# Patient Record
Sex: Male | Born: 1980 | Race: White | Hispanic: No | Marital: Single | Smoking: Current every day smoker
Health system: Southern US, Community
[De-identification: ages and names within clinical notes are randomized; demographics above are authoritative.]

---

## 2011-01-27 ENCOUNTER — Emergency Department: Payer: Self-pay | Admitting: Emergency Medicine

## 2011-01-29 ENCOUNTER — Ambulatory Visit: Payer: Self-pay | Admitting: Family Medicine

## 2012-06-23 ENCOUNTER — Emergency Department: Payer: Self-pay

## 2012-06-23 LAB — HEPATIC FUNCTION PANEL A (ARMC)
Alkaline Phosphatase: 79 U/L (ref 50–136)
Bilirubin, Direct: 0.2 mg/dL (ref 0.00–0.20)
Bilirubin,Total: 1.1 mg/dL — ABNORMAL HIGH (ref 0.2–1.0)
SGOT(AST): 65 U/L — ABNORMAL HIGH (ref 15–37)
SGPT (ALT): 48 U/L (ref 12–78)

## 2012-06-23 LAB — DRUG SCREEN, URINE
Amphetamines, Ur Screen: NEGATIVE (ref ?–1000)
Barbiturates, Ur Screen: NEGATIVE (ref ?–200)
Cannabinoid 50 Ng, Ur ~~LOC~~: NEGATIVE (ref ?–50)
Methadone, Ur Screen: NEGATIVE (ref ?–300)
Opiate, Ur Screen: POSITIVE (ref ?–300)
Tricyclic, Ur Screen: NEGATIVE (ref ?–1000)

## 2012-06-23 LAB — BASIC METABOLIC PANEL
Anion Gap: 7 (ref 7–16)
BUN: 12 mg/dL (ref 7–18)
Calcium, Total: 7.5 mg/dL — ABNORMAL LOW (ref 8.5–10.1)
Chloride: 84 mmol/L — ABNORMAL LOW (ref 98–107)
EGFR (African American): >60
EGFR (Non-African Amer.): >60
Glucose: 102 mg/dL — ABNORMAL HIGH (ref 65–99)
Osmolality: 231 (ref 275–301)

## 2012-06-23 LAB — CBC
HCT: 41.3 % (ref 40.0–52.0)
MCV: 96 fL (ref 80–100)
RBC: 4.32 10*6/uL — ABNORMAL LOW (ref 4.40–5.90)
WBC: 9.7 10*3/uL (ref 3.8–10.6)

## 2013-04-14 ENCOUNTER — Inpatient Hospital Stay: Payer: Self-pay | Admitting: Psychiatry

## 2013-04-14 LAB — DRUG SCREEN, URINE
Amphetamines, Ur Screen: NEGATIVE (ref ?–1000)
Barbiturates, Ur Screen: NEGATIVE (ref ?–200)
Cannabinoid 50 Ng, Ur ~~LOC~~: NEGATIVE (ref ?–50)
Methadone, Ur Screen: NEGATIVE (ref ?–300)
Opiate, Ur Screen: NEGATIVE (ref ?–300)
Phencyclidine (PCP) Ur S: NEGATIVE (ref ?–25)
Tricyclic, Ur Screen: NEGATIVE (ref ?–1000)

## 2013-04-14 LAB — URINALYSIS, COMPLETE
Bacteria: NONE SEEN
Blood: NEGATIVE
Glucose,UR: NEGATIVE mg/dL (ref 0–75)
Leukocyte Esterase: NEGATIVE
Nitrite: NEGATIVE
Protein: NEGATIVE
RBC,UR: <1 /HPF (ref 0–5)
Specific Gravity: 1.024 (ref 1.003–1.030)
Squamous Epithelial: <1

## 2013-04-14 LAB — COMPREHENSIVE METABOLIC PANEL
Alkaline Phosphatase: 84 U/L (ref 50–136)
BUN: 8 mg/dL (ref 7–18)
Chloride: 107 mmol/L (ref 98–107)
EGFR (African American): >60
SGOT(AST): 135 U/L — ABNORMAL HIGH (ref 15–37)
SGPT (ALT): 91 U/L — ABNORMAL HIGH (ref 12–78)
Sodium: 142 mmol/L (ref 136–145)
Total Protein: 7.8 g/dL (ref 6.4–8.2)

## 2013-04-14 LAB — ETHANOL: Ethanol: 365 mg/dL

## 2013-04-14 LAB — CBC
HCT: 46.9 % (ref 40.0–52.0)
HGB: 16.1 g/dL (ref 13.0–18.0)
Platelet: 167 10*3/uL (ref 150–440)
RDW: 15.1 % — ABNORMAL HIGH (ref 11.5–14.5)
WBC: 8.1 10*3/uL (ref 3.8–10.6)

## 2013-04-14 LAB — TSH: Thyroid Stimulating Horm: 1.07 u[IU]/mL

## 2013-04-17 LAB — HEPATIC FUNCTION PANEL A (ARMC)
Albumin: 3.8 g/dL (ref 3.4–5.0)
Alkaline Phosphatase: 72 U/L (ref 50–136)
Bilirubin, Direct: 0.2 mg/dL (ref 0.00–0.20)
Bilirubin,Total: 0.9 mg/dL (ref 0.2–1.0)
SGOT(AST): 75 U/L — ABNORMAL HIGH (ref 15–37)
SGPT (ALT): 79 U/L — ABNORMAL HIGH (ref 12–78)
Total Protein: 6.5 g/dL (ref 6.4–8.2)

## 2013-09-12 LAB — COMPREHENSIVE METABOLIC PANEL
ALBUMIN: 4.5 g/dL (ref 3.4–5.0)
AST: 70 U/L — AB (ref 15–37)
Alkaline Phosphatase: 85 U/L
Anion Gap: 7 (ref 7–16)
BUN: 7 mg/dL (ref 7–18)
Bilirubin,Total: 0.5 mg/dL (ref 0.2–1.0)
CALCIUM: 8.9 mg/dL (ref 8.5–10.1)
CHLORIDE: 108 mmol/L — AB (ref 98–107)
CO2: 29 mmol/L (ref 21–32)
Creatinine: 0.71 mg/dL (ref 0.60–1.30)
EGFR (Non-African Amer.): >60
Glucose: 98 mg/dL (ref 65–99)
OSMOLALITY: 285 (ref 275–301)
Potassium: 3.2 mmol/L — ABNORMAL LOW (ref 3.5–5.1)
SGPT (ALT): 62 U/L (ref 12–78)
Sodium: 144 mmol/L (ref 136–145)
Total Protein: 7.9 g/dL (ref 6.4–8.2)

## 2013-09-12 LAB — CBC
HCT: 51.4 % (ref 40.0–52.0)
HGB: 17 g/dL (ref 13.0–18.0)
MCH: 32.5 pg (ref 26.0–34.0)
MCHC: 33.1 g/dL (ref 32.0–36.0)
MCV: 98 fL (ref 80–100)
PLATELETS: 177 10*3/uL (ref 150–440)
RBC: 5.24 10*6/uL (ref 4.40–5.90)
RDW: 14.5 % (ref 11.5–14.5)
WBC: 8.2 10*3/uL (ref 3.8–10.6)

## 2013-09-12 LAB — URINALYSIS, COMPLETE
BILIRUBIN, UR: NEGATIVE
Bacteria: NONE SEEN
Blood: NEGATIVE
Glucose,UR: NEGATIVE mg/dL (ref 0–75)
Ketone: NEGATIVE
LEUKOCYTE ESTERASE: NEGATIVE
Nitrite: NEGATIVE
Ph: 5 (ref 4.5–8.0)
Protein: NEGATIVE
RBC,UR: NONE SEEN /HPF (ref 0–5)
Specific Gravity: 1.008 (ref 1.003–1.030)
Squamous Epithelial: NONE SEEN
WBC UR: 2 /HPF (ref 0–5)

## 2013-09-12 LAB — DRUG SCREEN, URINE
Amphetamines, Ur Screen: NEGATIVE (ref ?–1000)
BENZODIAZEPINE, UR SCRN: NEGATIVE (ref ?–200)
Barbiturates, Ur Screen: NEGATIVE (ref ?–200)
CANNABINOID 50 NG, UR ~~LOC~~: NEGATIVE (ref ?–50)
Cocaine Metabolite,Ur ~~LOC~~: NEGATIVE (ref ?–300)
MDMA (ECSTASY) UR SCREEN: NEGATIVE (ref ?–500)
Methadone, Ur Screen: NEGATIVE (ref ?–300)
Opiate, Ur Screen: NEGATIVE (ref ?–300)
PHENCYCLIDINE (PCP) UR S: NEGATIVE (ref ?–25)
Tricyclic, Ur Screen: NEGATIVE (ref ?–1000)

## 2013-09-12 LAB — SALICYLATE LEVEL: SALICYLATES, SERUM: 2.9 mg/dL — AB

## 2013-09-12 LAB — ETHANOL
Ethanol %: 0.543 % (ref 0.000–0.080)
Ethanol: 543 mg/dL

## 2013-09-12 LAB — ACETAMINOPHEN LEVEL: Acetaminophen: <2 ug/mL

## 2013-09-13 ENCOUNTER — Inpatient Hospital Stay: Payer: Self-pay | Admitting: Psychiatry

## 2013-09-13 LAB — ETHANOL
Ethanol %: 0.137 % — ABNORMAL HIGH (ref 0.000–0.080)
Ethanol: 137 mg/dL

## 2013-10-05 ENCOUNTER — Emergency Department: Payer: Self-pay | Admitting: Emergency Medicine

## 2013-10-05 LAB — COMPREHENSIVE METABOLIC PANEL
ALBUMIN: 4.4 g/dL (ref 3.4–5.0)
ALT: 55 U/L (ref 12–78)
AST: 39 U/L — AB (ref 15–37)
Alkaline Phosphatase: 103 U/L
Anion Gap: 6 — ABNORMAL LOW (ref 7–16)
BUN: 10 mg/dL (ref 7–18)
Bilirubin,Total: 0.4 mg/dL (ref 0.2–1.0)
CHLORIDE: 109 mmol/L — AB (ref 98–107)
CO2: 28 mmol/L (ref 21–32)
CREATININE: 0.9 mg/dL (ref 0.60–1.30)
Calcium, Total: 9 mg/dL (ref 8.5–10.1)
EGFR (Non-African Amer.): >60
GLUCOSE: 95 mg/dL (ref 65–99)
OSMOLALITY: 284 (ref 275–301)
POTASSIUM: 3.8 mmol/L (ref 3.5–5.1)
SODIUM: 143 mmol/L (ref 136–145)
Total Protein: 7.9 g/dL (ref 6.4–8.2)

## 2013-10-05 LAB — URINALYSIS, COMPLETE
Bilirubin,UR: NEGATIVE
Blood: NEGATIVE
GLUCOSE, UR: NEGATIVE mg/dL (ref 0–75)
Ketone: NEGATIVE
NITRITE: NEGATIVE
Ph: 5 (ref 4.5–8.0)
Protein: NEGATIVE
RBC,UR: 1 /HPF (ref 0–5)
Specific Gravity: 1.013 (ref 1.003–1.030)
Squamous Epithelial: NONE SEEN
WBC UR: 29 /HPF (ref 0–5)

## 2013-10-05 LAB — CBC
HCT: 45.1 % (ref 40.0–52.0)
HGB: 15.6 g/dL (ref 13.0–18.0)
MCH: 32.5 pg (ref 26.0–34.0)
MCHC: 34.5 g/dL (ref 32.0–36.0)
MCV: 94 fL (ref 80–100)
PLATELETS: 262 10*3/uL (ref 150–440)
RBC: 4.8 10*6/uL (ref 4.40–5.90)
RDW: 13 % (ref 11.5–14.5)
WBC: 7.4 10*3/uL (ref 3.8–10.6)

## 2013-10-05 LAB — DRUG SCREEN, URINE
AMPHETAMINES, UR SCREEN: NEGATIVE (ref ?–1000)
Barbiturates, Ur Screen: NEGATIVE (ref ?–200)
Benzodiazepine, Ur Scrn: POSITIVE (ref ?–200)
Cannabinoid 50 Ng, Ur ~~LOC~~: NEGATIVE (ref ?–50)
Cocaine Metabolite,Ur ~~LOC~~: NEGATIVE (ref ?–300)
MDMA (ECSTASY) UR SCREEN: NEGATIVE (ref ?–500)
Methadone, Ur Screen: NEGATIVE (ref ?–300)
OPIATE, UR SCREEN: NEGATIVE (ref ?–300)
PHENCYCLIDINE (PCP) UR S: NEGATIVE (ref ?–25)
Tricyclic, Ur Screen: NEGATIVE (ref ?–1000)

## 2013-10-05 LAB — SALICYLATE LEVEL: Salicylates, Serum: 2.3 mg/dL

## 2013-10-05 LAB — ETHANOL
ETHANOL LVL: 292 mg/dL
Ethanol %: 0.292 % — ABNORMAL HIGH (ref 0.000–0.080)

## 2013-10-05 LAB — ACETAMINOPHEN LEVEL: Acetaminophen: <2 ug/mL

## 2013-10-06 LAB — URINE CULTURE

## 2013-10-10 ENCOUNTER — Emergency Department: Payer: Self-pay | Admitting: Emergency Medicine

## 2013-10-10 LAB — URINALYSIS, COMPLETE
BACTERIA: NONE SEEN
BILIRUBIN, UR: NEGATIVE
Blood: NEGATIVE
GLUCOSE, UR: NEGATIVE mg/dL (ref 0–75)
Hyaline Cast: 6
Ketone: NEGATIVE
Leukocyte Esterase: NEGATIVE
Nitrite: NEGATIVE
Ph: 5 (ref 4.5–8.0)
Protein: 30
RBC,UR: 1 /HPF (ref 0–5)
Specific Gravity: 1.017 (ref 1.003–1.030)
Squamous Epithelial: NONE SEEN
WBC UR: 4 /HPF (ref 0–5)

## 2013-10-10 LAB — CBC
HCT: 50.5 % (ref 40.0–52.0)
HGB: 16.5 g/dL (ref 13.0–18.0)
MCH: 30.8 pg (ref 26.0–34.0)
MCHC: 32.7 g/dL (ref 32.0–36.0)
MCV: 94 fL (ref 80–100)
PLATELETS: 256 10*3/uL (ref 150–440)
RBC: 5.35 10*6/uL (ref 4.40–5.90)
RDW: 13.5 % (ref 11.5–14.5)
WBC: 9 10*3/uL (ref 3.8–10.6)

## 2013-10-10 LAB — COMPREHENSIVE METABOLIC PANEL
ALK PHOS: 116 U/L
Albumin: 4.7 g/dL (ref 3.4–5.0)
Anion Gap: 8 (ref 7–16)
BUN: 9 mg/dL (ref 7–18)
Bilirubin,Total: 0.5 mg/dL (ref 0.2–1.0)
CHLORIDE: 105 mmol/L (ref 98–107)
CO2: 27 mmol/L (ref 21–32)
Calcium, Total: 9.3 mg/dL (ref 8.5–10.1)
Creatinine: 0.8 mg/dL (ref 0.60–1.30)
EGFR (African American): >60
GLUCOSE: 90 mg/dL (ref 65–99)
Osmolality: 278 (ref 275–301)
POTASSIUM: 4.1 mmol/L (ref 3.5–5.1)
SGOT(AST): 42 U/L — ABNORMAL HIGH (ref 15–37)
SGPT (ALT): 53 U/L (ref 12–78)
SODIUM: 140 mmol/L (ref 136–145)
Total Protein: 8.6 g/dL — ABNORMAL HIGH (ref 6.4–8.2)

## 2013-10-10 LAB — DRUG SCREEN, URINE

## 2013-10-10 LAB — ETHANOL
Ethanol %: 0.392 % (ref 0.000–0.080)
Ethanol: 392 mg/dL

## 2013-10-10 LAB — SALICYLATE LEVEL: Salicylates, Serum: 3.3 mg/dL — ABNORMAL HIGH

## 2013-10-10 LAB — ACETAMINOPHEN LEVEL: Acetaminophen: <2 ug/mL

## 2013-12-07 LAB — ETHANOL
Ethanol %: 0.437 % (ref 0.000–0.080)
Ethanol: 437 mg/dL

## 2013-12-07 LAB — CBC
HCT: 47.2 % (ref 40.0–52.0)
HGB: 15.3 g/dL (ref 13.0–18.0)
MCH: 31.5 pg (ref 26.0–34.0)
MCHC: 32.4 g/dL (ref 32.0–36.0)
MCV: 97 fL (ref 80–100)
PLATELETS: 268 10*3/uL (ref 150–440)
RBC: 4.85 10*6/uL (ref 4.40–5.90)
RDW: 15.2 % — ABNORMAL HIGH (ref 11.5–14.5)
WBC: 6.9 10*3/uL (ref 3.8–10.6)

## 2013-12-07 LAB — COMPREHENSIVE METABOLIC PANEL
ALBUMIN: 4.7 g/dL (ref 3.4–5.0)
AST: 56 U/L — AB (ref 15–37)
Alkaline Phosphatase: 92 U/L
Anion Gap: 8 (ref 7–16)
BUN: 10 mg/dL (ref 7–18)
Bilirubin,Total: 0.7 mg/dL (ref 0.2–1.0)
CHLORIDE: 109 mmol/L — AB (ref 98–107)
CO2: 25 mmol/L (ref 21–32)
Calcium, Total: 9.2 mg/dL (ref 8.5–10.1)
Creatinine: 0.78 mg/dL (ref 0.60–1.30)
EGFR (African American): >60
EGFR (Non-African Amer.): >60
Glucose: 87 mg/dL (ref 65–99)
OSMOLALITY: 282 (ref 275–301)
Potassium: 3.9 mmol/L (ref 3.5–5.1)
SGPT (ALT): 46 U/L (ref 12–78)
Sodium: 142 mmol/L (ref 136–145)
Total Protein: 8.1 g/dL (ref 6.4–8.2)

## 2013-12-07 LAB — SALICYLATE LEVEL: SALICYLATES, SERUM: 2.3 mg/dL

## 2013-12-07 LAB — ACETAMINOPHEN LEVEL: Acetaminophen: <2 ug/mL

## 2013-12-08 ENCOUNTER — Inpatient Hospital Stay: Payer: Self-pay | Admitting: Psychiatry

## 2013-12-08 LAB — URINALYSIS, COMPLETE
BILIRUBIN, UR: NEGATIVE
Bacteria: NONE SEEN
Blood: NEGATIVE
Glucose,UR: NEGATIVE mg/dL (ref 0–75)
Nitrite: NEGATIVE
Ph: 5 (ref 4.5–8.0)
Protein: NEGATIVE
RBC,UR: 3 /HPF (ref 0–5)
Specific Gravity: 1.026 (ref 1.003–1.030)
Squamous Epithelial: NONE SEEN

## 2013-12-08 LAB — DRUG SCREEN, URINE

## 2013-12-08 LAB — ETHANOL
Ethanol %: 0.059 % (ref 0.000–0.080)
Ethanol: 59 mg/dL

## 2014-04-20 ENCOUNTER — Ambulatory Visit: Payer: Self-pay

## 2014-04-26 ENCOUNTER — Emergency Department: Payer: Self-pay | Admitting: Student

## 2014-04-26 LAB — CBC
HCT: 46.1 % (ref 40.0–52.0)
HGB: 14.9 g/dL (ref 13.0–18.0)
MCH: 32.1 pg (ref 26.0–34.0)
MCHC: 32.3 g/dL (ref 32.0–36.0)
MCV: 99 fL (ref 80–100)
Platelet: 218 10*3/uL (ref 150–440)
RBC: 4.64 10*6/uL (ref 4.40–5.90)
RDW: 14 % (ref 11.5–14.5)
WBC: 5.3 10*3/uL (ref 3.8–10.6)

## 2014-04-26 LAB — COMPREHENSIVE METABOLIC PANEL
ANION GAP: 9 (ref 7–16)
Albumin: 4 g/dL (ref 3.4–5.0)
Alkaline Phosphatase: 80 U/L
BUN: 6 mg/dL — AB (ref 7–18)
Bilirubin,Total: 0.4 mg/dL (ref 0.2–1.0)
CALCIUM: 8.3 mg/dL — AB (ref 8.5–10.1)
CHLORIDE: 111 mmol/L — AB (ref 98–107)
CREATININE: 0.71 mg/dL (ref 0.60–1.30)
Co2: 26 mmol/L (ref 21–32)
EGFR (African American): >60
EGFR (Non-African Amer.): >60
GLUCOSE: 106 mg/dL — AB (ref 65–99)
Osmolality: 289 (ref 275–301)
Potassium: 3.7 mmol/L (ref 3.5–5.1)
SGOT(AST): 52 U/L — ABNORMAL HIGH (ref 15–37)
SGPT (ALT): 56 U/L
SODIUM: 146 mmol/L — AB (ref 136–145)
TOTAL PROTEIN: 7.3 g/dL (ref 6.4–8.2)

## 2014-04-26 LAB — SALICYLATE LEVEL: Salicylates, Serum: 3.3 mg/dL — ABNORMAL HIGH

## 2014-04-26 LAB — URINALYSIS, COMPLETE
BACTERIA: NONE SEEN
Bilirubin,UR: NEGATIVE
Blood: NEGATIVE
Glucose,UR: NEGATIVE mg/dL (ref 0–75)
Ketone: NEGATIVE
LEUKOCYTE ESTERASE: NEGATIVE
Nitrite: NEGATIVE
PH: 5 (ref 4.5–8.0)
Protein: NEGATIVE
RBC,UR: <1 /HPF (ref 0–5)
SQUAMOUS EPITHELIAL: NONE SEEN
Specific Gravity: 1.008 (ref 1.003–1.030)
WBC UR: 5 /HPF (ref 0–5)

## 2014-04-26 LAB — DRUG SCREEN, URINE

## 2014-04-26 LAB — ETHANOL: ETHANOL LVL: 435 mg/dL — AB

## 2014-04-26 LAB — ACETAMINOPHEN LEVEL: Acetaminophen: <2 ug/mL

## 2014-05-06 ENCOUNTER — Emergency Department: Payer: Self-pay | Admitting: Emergency Medicine

## 2014-05-06 LAB — CBC
HCT: 54.4 % — AB (ref 40.0–52.0)
HGB: 17.6 g/dL (ref 13.0–18.0)
MCH: 32.8 pg (ref 26.0–34.0)
MCHC: 32.4 g/dL (ref 32.0–36.0)
MCV: 101 fL — AB (ref 80–100)
PLATELETS: 244 10*3/uL (ref 150–440)
RBC: 5.38 10*6/uL (ref 4.40–5.90)
RDW: 14.6 % — ABNORMAL HIGH (ref 11.5–14.5)
WBC: 13.5 10*3/uL — ABNORMAL HIGH (ref 3.8–10.6)

## 2014-05-06 LAB — URINALYSIS, COMPLETE
BILIRUBIN, UR: NEGATIVE
Glucose,UR: NEGATIVE mg/dL (ref 0–75)
Hyaline Cast: 3
LEUKOCYTE ESTERASE: NEGATIVE
Nitrite: NEGATIVE
Ph: 5 (ref 4.5–8.0)
RBC,UR: 3 /HPF (ref 0–5)
SPECIFIC GRAVITY: 1.024 (ref 1.003–1.030)
Squamous Epithelial: 1
WBC UR: 8 /HPF (ref 0–5)

## 2014-05-06 LAB — COMPREHENSIVE METABOLIC PANEL
ALBUMIN: 5.1 g/dL — AB (ref 3.4–5.0)
ALT: 74 U/L — AB
ANION GAP: 11 (ref 7–16)
Alkaline Phosphatase: 96 U/L
BILIRUBIN TOTAL: 0.8 mg/dL (ref 0.2–1.0)
BUN: 12 mg/dL (ref 7–18)
CALCIUM: 8.9 mg/dL (ref 8.5–10.1)
CO2: 26 mmol/L (ref 21–32)
CREATININE: 0.81 mg/dL (ref 0.60–1.30)
Chloride: 104 mmol/L (ref 98–107)
EGFR (Non-African Amer.): >60
Glucose: 81 mg/dL (ref 65–99)
Osmolality: 280 (ref 275–301)
Potassium: 3.8 mmol/L (ref 3.5–5.1)
SGOT(AST): 69 U/L — ABNORMAL HIGH (ref 15–37)
SODIUM: 141 mmol/L (ref 136–145)
Total Protein: 8.9 g/dL — ABNORMAL HIGH (ref 6.4–8.2)

## 2014-05-06 LAB — DRUG SCREEN, URINE
Amphetamines, Ur Screen: NEGATIVE (ref ?–1000)
BARBITURATES, UR SCREEN: NEGATIVE (ref ?–200)
Benzodiazepine, Ur Scrn: NEGATIVE (ref ?–200)
Cannabinoid 50 Ng, Ur ~~LOC~~: NEGATIVE (ref ?–50)
Cocaine Metabolite,Ur ~~LOC~~: NEGATIVE (ref ?–300)
MDMA (ECSTASY) UR SCREEN: NEGATIVE (ref ?–500)
METHADONE, UR SCREEN: NEGATIVE (ref ?–300)
Opiate, Ur Screen: NEGATIVE (ref ?–300)
Phencyclidine (PCP) Ur S: NEGATIVE (ref ?–25)
Tricyclic, Ur Screen: NEGATIVE (ref ?–1000)

## 2014-05-06 LAB — ETHANOL: ETHANOL LVL: 454 mg/dL — AB

## 2014-05-06 LAB — SALICYLATE LEVEL: SALICYLATES, SERUM: 2.9 mg/dL — AB

## 2014-05-06 LAB — ACETAMINOPHEN LEVEL

## 2014-05-06 LAB — TSH: THYROID STIMULATING HORM: 0.46 u[IU]/mL
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
## 2014-11-25 NOTE — Consult Note (Signed)
PATIENT NAME:  [name], [name] MR#:  [id number] DATE OF BIRTH:  23-Feb-1981  DATE OF ADMISSION: 10/10/2013   DATE OF CONSULTATION:  10/11/2013  REFERRING PHYSICIAN:  Aneta MinsPhillip A. Scotty CourtStafford, MD CONSULTING PHYSICIAN:  Ardeen FillersUzma S. Garnetta BuddyFaheem, MD  REASON FOR CONSULTATION: "I wanted to meet my mother."   HISTORY OF PRESENT ILLNESS: The patient is a 34 year old Caucasian male with history of recurrent presentations to the ED, presented again after he was voicing suicidal thoughts with a plan to shoot himself. He was intoxicated with a blood alcohol level of 392. The patient was recently released from the ED on 10/05/2013, when he presented intoxicated with superficial cuts to his wrist. The patient stated that he has been drinking vodka a lot for the past 1 week. He has been consuming approximately one fifth or more on a daily basis. His last drink was last night. The patient reported that after he left here, he went to the [facility] for a week, but then started drinking steadily for the past 1 week. He stated that they sent him home from the [facility]. He stated that he "feels like crap." He feels sick to his stomach at this time. He stated that the church people brought him here as the police came to his home, and he was not found over there. The patient reported that he is currently experiencing suicidal and homicidal ideations with a plan to shoot himself. The patient stated that he has been noncompliant with his medications. The patient reported that he has been having anxiety symptoms, and his behavior gets worse when he starts drinking. He also starts having withdrawal symptoms as well. The patient stated that he is not having any psychotic symptoms at this time.   PAST PSYCHIATRIC HISTORY: The patient has a long history of alcohol use with multiple presentations to the hospital with similar symptoms. He was just detoxed and then treated for substance abuse and referred to the [facility]. He was there for less than 2 weeks. The  patient has other hospitalizations in the past as well. He remains noncompliant with his medications. He has past history of suicidal threats and cutting.   PAST MEDICAL HISTORY: The patient has history of cutting himself on his left wrist, which did not require any further treatment.   SOCIAL HISTORY: The patient is currently unemployed. He lives with a roommate. He does not work at this time.   FAMILY HISTORY: Positive for alcohol abuse.   CURRENT MEDICATIONS: Fluoxetine 20 mg daily, prazosin 2 mg at night, trazodone 150 mg at night, hydroxyzine 50 mg q.6 hours p.r.n. for anxiety.   ALLERGIES: No known drug allergies.   REVIEW OF SYSTEMS:  CONSTITUTIONAL: Denies any fever or chills. No weight changes.  EYES: No double or blurred vision.  RESPIRATORY: No shortness of breath or cough.  CARDIOVASCULAR: No chest pain or orthopnea.  GASTROINTESTINAL: No abdominal pain, nausea, vomiting or diarrhea.  GENITOURINARY: No incontinence or frequency.  ENDOCRINE: No heat or cold intolerance.  LYMPHATIC: No anemia or easy bruising.  INTEGUMENTARY: No acne or rash.  MUSCULOSKELETAL: No muscle or joint pain.  NEUROLOGIC: Some shaking noted.   VITAL SIGNS: Temperature 98.2, pulse 88, respirations 18, blood pressure 125/70.  LABORATORY DATA: Glucose 90, BUN 9, creatinine 0.80, sodium 140, potassium 4.1, chloride 105, bicarbonate 27, anion gap 8, osmolality 278, calcium 9.3. Blood alcohol level 392. Protein 8.6, albumin 4.7, bilirubin 0.5, alkaline phosphatase 116, AST 42, ALT 53. UDS is negative. WBC 9.0, RBC 5.35, hemoglobin 16.5,  hematocrit 50.5, platelet count 256. MCV is 94, RDW is 13.5.   MENTAL STATUS EXAMINATION: The patient is a slightly disheveled-looking gentleman with long hair and passively cooperative at this time. He maintained fair eye contact. His speech was low in tone and volume. Mood was depressed. Affect was blunted. Thought content was logical and goal-directed. Thought content was  non-delusional. He currently denied having any auditory or visual hallucinations. He was having some passive suicidal ideations. He demonstrated poor insight and judgment as he continues to have a similar presentations. His language was intact. Fund of knowledge appears appropriate. His memory appears intact.   DIAGNOSTIC IMPRESSION:  AXIS I:  1. Alcohol dependence.  2. Alcohol withdrawal.  3. Alcohol-induced mood disorder.  AXIS II: Borderline histrionic personality disorder.  AXIS III: Please review the medical history.   TREATMENT PLAN:  1. The patient will be admitted to the Inpatient Behavioral Health Unit for stabilization and safety.  2. He will be started on Librium 50 mg q.4 hours for withdrawal symptoms from alcohol as his blood alcohol level is around 392 at this time.  3. He will also be started on CIWA protocol.  4. He will be evaluated by the treatment team, and then his medications will be adjusted.   Thank you for allowing me to participate in the care of this patient.   ____________________________ Ardeen Fillers. Garnetta Buddy, MD usf:lb D: 10/11/2013 13:38:28 ET T: 10/11/2013 14:17:32 ET JOB#: [id number]  cc: Ardeen Fillers. Garnetta Buddy, MD, <Dictator> Rhunette Croft MD ELECTRONICALLY SIGNED 10/13/2013 9:39

## 2014-11-25 NOTE — Discharge Summary (Signed)
PATIENT NAME:  [name], [name] MR#:  [id number] DATE OF BIRTH:  [birth year]  DATE OF ADMISSION:  09/13/2013 DATE OF DISCHARGE:  09/22/2013  HOSPITAL COURSE: See dictated history and physical for details of admission. This 34 year old man with a history of alcohol abuse and anxiety complaints, came to the hospital with an extremely high blood alcohol level. He had been drinking heavily. He was treated with the standard detox protocol. He did not have a seizure and did not show signs of delirium. He had some alcohol withdrawal symptoms, which were treated successfully with benzodiazepines. He has now tapered off of those medicines completely and is free of acute alcohol withdrawal symptoms. The patient also complains of chronic anxiety symptoms. He has been, at times, given a diagnosis of PTSD, although generalized anxiety disorder and panic attacks seem to be part of the issue as well as chronic mild depression. He has not expressed acute suicidality, although he tends to be chronically hopeless. The patient is very anxious about his relapse into substance abuse and was strongly requesting to go to [facility]. He felt the program had been very helpful for him in the past and he wanted to return there for further substance abuse treatment. He has now been accepted and will be transferred there. He is not acutely suicidal, but still had some hopelessness and feels very uncomfortable about being out on the street. He has been treated for his anxiety with Prozac, trazodone and hydroxyzine. The patient has a strong feeling still that he should be on standing benzodiazepines, which is an issue we have talked about at length about the dubious appropriateness of that and so I have not started him on them.   DISCHARGE MEDICATIONS: Prazosin 2 mg p.o. at bedtime, fluoxetine 20 mg p.o. daily, trazodone 150 mg at night and hydroxyzine 50 mg q.6 hours p.r.n. for agitation and anxiety.   LABORATORY RESULTS: Labs on admission  included drug screen that was negative, but a blood alcohol level of 543 on initial presentation. Potassium low at 3.2, chloride low at 108, AST elevated at 70. The rest of the chemistry panel was unremarkable. Hematology panel was all normal. Urinalysis was unremarkable. Acetaminophen level was negative. Salicylates in the therapeutic range at 2.9.   MENTAL STATUS EXAM AT DISCHARGE: Neatly dressed and groomed man, who looks his stated age, cooperative with the interview. Good eye contact, normal psychomotor activity. Speech normal rate, tone and volume. Affect still a little bit blunted and dysphoric, but controlled. Mood stated as being anxious. Thoughts are lucid without loosening of associations or delusions. Denies auditory or visual hallucinations. Denies acute suicidal or homicidal ideation. Judgment and insight are good. Intelligence normal. Fund of knowledge normal. Short-term memory intact, 3 out of 3 objects immediately and at 1 minute. Long-term memory grossly intact. Alert and oriented x 4.   DISPOSITION: Discharge to the alcohol and drug abuse treatment center under involuntary commitment.   DIAGNOSIS, PRINCIPAL AND PRIMARY:  AXIS I: Alcohol dependence.  SECONDARY DIAGNOSES:  AXIS I:  1.  Anxiety disorder, not otherwise specified, rule out posttraumatic stress disorder verses generalized anxiety disorder.  2.  Dysthymia.  AXIS II: Histrionic and borderline features at least.  AXIS III: No diagnosis.  AXIS IV: Severe from lack of support and resources outside the hospital.  AXIS V: Functioning at time of transfer 50. ____________________________ Audery Amel, MD jtc:aw D: 09/22/2013 09:37:28 ET T: 09/22/2013 10:06:53 ET JOB#: [id number]  cc: Audery Amel, MD, <Dictator> Audery Amel  MD ELECTRONICALLY SIGNED 09/23/2013 14:24
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
## 2014-11-25 NOTE — Consult Note (Signed)
Brief Consult Note: Diagnosis: alcohol abuse, ptsd.   Patient was seen by consultant.   Consult note dictated.   Orders entered.   Discussed with Attending MD.   Comments: Psychiatry: Patient seen and chart reviewed. Orders done. Discussed with ER MD. Patient can be released from IVC. SCripts done. Discharge with follow up [facility].  Electronic Signatures: Malaak Stach, Jackquline DenmarkJohn T (MD)  (Signed 23-Sep-15 17:49)  Authored: Brief Consult Note   Last Updated: 23-Sep-15 17:49 by Audery Amellapacs, Duell Holdren T (MD)

## 2014-11-25 NOTE — Consult Note (Signed)
PATIENT NAME:  [name], [name] MR#:  [id number] DATE OF BIRTH:  August 22, 1980  DATE OF CONSULTATION:  04/26/2014  CONSULTING PHYSICIAN:  Audery AmelJohn T. Analise Glotfelty, MD  IDENTIFYING INFORMATION AND REASON FOR CONSULTATION: This is a 34 year old male with a history of alcohol abuse and problems with anxiety and depression, who came into the hospital intoxicated, allegedly having made suicidal statements. Consultation for appropriate disposition and treatment.   HISTORY OF PRESENT ILLNESS: Information obtained from the patient and the chart. The patient tells me that he was fired from his job yesterday and because he was so upset he went and got drunk, which he had not done in a while. He cannot remember how much he drank, but figures that it was probably at least 1/2 gallon of liquor. He denied using any other drugs. Said prior to that his mood has been slipping down and getting depressed for the last couple of weeks. He had not been compliant with [facility]. He is somewhat evasive with me about whether he had been compliant with his prescription medication. He said he had been trying to work on his drinking and had cut down his use to only about once every 2 weeks. He admits that he made a suicidal statement when he was intoxicated, but says that he did try anything and he cannot remember really having any serious suicidal thoughts. Does not want to kill himself at this point. Denies that he has been having any psychotic symptoms. He does have chronic nightmares.   PAST PSYCHIATRIC HISTORY: Several prior admissions for alcohol abuse and mood symptoms. History of self-mutilation. Has been diagnosed with PTSD and personality disorder in addition to his alcohol abuse problem. [facility] Most recent medication was trazodone, prazosin for  nightmares and fluoxetine.    FAMILY HISTORY: Positive for alcohol abuse.   SOCIAL HISTORY: Currently, living with a roommate. 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 Not currently in any relationship. Fairly limited social activities.   PAST MEDICAL HISTORY: No significant ongoing medical problems.   CURRENT MEDICATIONS:  He claims that he is taking his trazodone, prazosin, and Prozac. We do not have doses, but that would probably be trazodone 150 mg at night, prazosin 2 mg at night, and Prozac 20 mg a day.   ALLERGIES: No known drug allergies.   REVIEW OF SYSTEMS: The patient currently does not feel particularly shaky. A little bit lightheaded still. Not feeling like he is going to fall down. No double vision. No tachycardia. Full 9-point review of systems all negative. No suicidal ideation. No hallucinations.   MENTAL STATUS EXAMINATION: Slightly disheveled man who looks his stated age, cooperative with the interview. Eye contact good. Psychomotor activity a little slow. Speech normal rate, tone, and volume. Affect slightly anxious and dysphoric, but reactive. Mood stated as being okay now. Thoughts are lucid without loosening of associations or delusions. Denies auditory or visual hallucinations. Denies suicidal or homicidal ideation. Shows adequate insight and judgment. Normal intelligence. Alert and oriented x4. Could remember 3/3 objects immediately and only 1/3 at 3 minutes. Does not appear to be in any real acute distress.   PHYSICAL EXAMINATION:  VITAL SIGNS:  Most recent vital signs show a blood pressure of 130/81, respirations 20, pulse 107, temperature 97.7.  NEUROLOGIC:  He is able to walk without any unsteadiness, move all extremities. Cranial  nerves intact.   LABORATORY DATA:  Alcohol level this morning at about 10:00 was 435. Chemistry panel shows a low calcium 8.3, elevated chloride 111. Slightly elevated AST at 52, sodium slightly elevated at 146. Drug screen negative. CBC all normal.   ASSESSMENT: This is a 34 year old man with alcohol  abuse and chronic anxiety and depression. He also has a past history of benzodiazepine abuse and, as usual, is trying to ask me for Xanax, which has been a common theme in the past. He is currently lucid, not showing any signs of being acutely suicidal. Cooperative with treatment. Not having major alcohol withdrawal symptoms. He does not have a past history of seizures or delirium tremens. I think he can safely be discharged home as he is requesting. The patient needs to get followup treatment to continue staying sober and controlling his mood.   TREATMENT PLAN: Discontinue IVC. Restart medicines including Prozac, trazodone, and prazosin. Prescriptions printed out and will be given to him. Counseling done. Refer back to [facility] with strong encouragement to go to [facility] [employer] as well.   DIAGNOSIS, PRINCIPAL AND PRIMARY:  AXIS I: Alcohol abuse.   SECONDARY DIAGNOSES: AXIS I:  Posttraumatic stress disorder.  Depression, not otherwise specified.   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   ____________________________ Audery Amel, MD jtc:lr D: 04/26/2014 17:55:23 ET T: 04/26/2014 18:11:44 ET JOB#: [id number]  cc: Audery Amel, MD, <Dictator> Audery Amel MD ELECTRONICALLY SIGNED 04/27/2014 0:13
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
## 2014-11-25 NOTE — Consult Note (Signed)
PATIENT NAME:  [name], [name] MR#:  [id number] DATE OF BIRTH:  1981-06-02  DATE OF CONSULTATION:  05/08/2014  CONSULTING PHYSICIAN:  Audery AmelJohn T. Becca Bayne, MD  IDENTIFYING INFORMATION AND REASON FOR CONSULTATION: This is a 34 year old male with a history of alcohol dependence and anxiety who presented to the hospital 2 days ago very intoxicated, agitated. The patient today obviously has sobered up and was able to give me a history.   HISTORY OF PRESENT ILLNESS: The patient states that after he was last here in the Emergency Room on 09/23 he was picked up by police and taken to jail in [place] where he spent 5 days. He said that they charged him with missing a court date, although he claims that he had never been informed of the court date. He said that after 5 days they let him go but that his nerves were shot. This is his excuse for why started back drinking again. He cannot remember most of it, but figures he must have had at least a whole bottle of vodka. Denies that he had been abusing any other drugs. The patient today states that his mood is feeling back to normal. Not depressed. No suicidal or homicidal ideation. Feels capable of taking care of himself. He has chronic general anxiety, but he is not feeling out of control with it. He has not followed up with any outpatient treatment since we saw him last and is not taking any of his medicine.   PAST PSYCHIATRIC HISTORY: This patient has had several prior admissions and Emergency Room visits under similar circumstances. Usually intoxicated. He also complains of chronic anxiety. He says that he has been diagnosed with posttraumatic stress disorder and has been prescribed medicine for it but he is not taking it anymore. No history of suicide attempts, but he does have a history of self-mutilation.   FAMILY HISTORY: Positive for alcohol abuse.   SOCIAL HISTORY: The patient is back living with a roommate. Does not have a job set up yet because of being  put in jail for a few days. Limited social interaction.   PAST MEDICAL HISTORY: No ongoing medical problems.   MEDICATIONS: As of last time he was here, he was supposed to be on trazodone 150 mg at night, prazosin 2 mg at night, Prozac 20 mg a day.   ALLERGIES: No known drug allergies.   REVIEW OF SYSTEMS: Still feeling a little bit tired but arousable. No auditory or visual hallucinations, no suicidal or homicidal ideation. No specific physical symptoms. Not feeling shaky or agitated. Full review of systems is negative.   MENTAL STATUS EXAMINATION: Adequately groomed gentleman under the circumstances, looks his stated age, cooperative with the interview. Eye contact good. Psychomotor activity slow. Speech slow and decreased in amount. Affect blunted. Mood stated as fine. Thoughts lucid without loosening of associations or delusions. Denies auditory or visual hallucinations. Denies suicidal or homicidal ideation. Shows good insight and judgment. Normal intelligence. Alert and oriented x 4. Can remember 3/3 objects immediately and at 3 minutes. Long-term memory intact. Normal intelligence.   LABORATORY RESULTS: On admission drug screen negative. Urinalysis uninfected. Salicylates slightly elevated at 2.9, white count elevated at 13.5, hematocrit elevated at 54.4. Alcohol level 454.   VITAL SIGNS: Blood pressure 130/90, pulse 76, respirations 16, temperature 97.9.   ASSESSMENT: A 34 year old man with alcohol abuse, who has been in the hospital for a couple of days, sobering up. Not suicidal. Not at high risk of either seizures or delirium  tremens at this point. No indication for inpatient treatment. He has been referred to [facility]. We will repeat that encouragement to him. Supportive and educational counseling done with him with encouragement to get back on his medicine, follow up with therapy, go to AA meetings regularly. The patient is not under IVC. He can be discharged from the Emergency Room.    DIAGNOSIS, PRINCIPAL AND PRIMARY:   AXIS I: Alcohol abuse, severe.   SECONDARY DIAGNOSES:  AXIS I: No further.   AXIS II: Deferred.   AXIS III: Deferred.    ____________________________ Audery Amel, MD jtc:lt D: 05/08/2014 14:13:26 ET T: 05/08/2014 15:14:58 ET JOB#: [id number]  cc: Audery Amel, MD, <Dictator> Audery Amel MD ELECTRONICALLY SIGNED 05/11/2014 16:08

## 2014-11-25 NOTE — Consult Note (Signed)
Brief Consult Note: Diagnosis: alcohol dependence.   Patient was seen by consultant.   Consult note dictated.   Discussed with Attending MD.   Comments: Psychiatry: Patient with chronic alcohol dependence and anxiety cut wrist while intoxicated. No longer voicing suicidal ideation. Has a place to live and outpt treatment i place. Can be discharged home. Not neediing inpatient treatment.  Electronic Signatures: Audery Amellapacs, Jerris Keltz T (MD)  (Signed 04-Mar-15 18:57)  Authored: Brief Consult Note   Last Updated: 04-Mar-15 18:57 by Audery Amellapacs, Kelsey Durflinger T (MD)

## 2014-11-25 NOTE — Consult Note (Signed)
PATIENT NAME:  [name], [name] MR#:  [id number] DATE OF BIRTH:  1980/08/30  DATE OF CONSULTATION:  10/05/2013  CONSULTING PHYSICIAN:  Audery Amel, MD  IDENTIFYING INFORMATION AND REASON FOR CONSULTATION:  A 34 year old man with a history of alcohol abuse and anxiety disorder presented to the Emergency Room intoxicated with superficial cuts to his wrist. Consultation for appropriate disposition.   HISTORY OF PRESENT ILLNESS: Information obtained from the patient and the chart. The patient says that he got out of the alcohol and drug abuse treatment center in a few days ago. It sounds like he started back into drinking right away and has been drinking ever since. Yesterday, he drank particularly heavily. His anxiety symptoms have been back, including panic attacks and general anxiety. He reports that several people came over to visit him at his house which was an overwhelming stress for him and made him feel even more panicky. The patient does not make the connection with fact that his anxiety symptoms and behavior get worse when he drinks, instead he keeps seeing things as being he drinks to control his symptoms. Yesterday, while intoxicated, he cut himself superficially on the left wrist with a pair of paper scissors. Says that he could not go through with any more of it because of the pain. Not reporting any psychotic symptoms.   PAST PSYCHIATRIC HISTORY: The patient was just recently in the hospital for what turned into a fairly lengthy stay getting detoxed and then treated for substance abuse and referred to the alcohol and drug abuse treatment center. He stayed at the alcohol and drug abuse treatment center for less than 2 weeks. In the past, he has had other hospitalizations. He has been tried on medication. He has been at [facility] in the past as well. Past history of suicide threats and cutting. He is inconsistently compliant with outpatient treatment.   PAST MEDICAL HISTORY: He has a superficial  cut to his left wrist requiring no further treatment right now. He otherwise has no significant ongoing medical problems.   SOCIAL HISTORY: The patient is not employed. Lives with a roommate. Minimal transportation. It sounds like he has a good social life but a lot of it is with people that he abuses substances with.   FAMILY HISTORY: Positive for alcohol abuse.   CURRENT MEDICATIONS: He says they did not change anything at [facility]. He is taking fluoxetine 20 mg a day, prazosin 2 mg at night, trazodone 150 mg at night, hydroxyzine 50 mg q.6 p.r.n. for anxiety.   ALLERGIES: No known drug allergies.   REVIEW OF SYSTEMS:  Tired. Anxious. Not suicidal. Not psychotic. Some soreness to the left wrist, otherwise, no significant complaints.   MENTAL STATUS EXAMINATION: Slightly disheveled gentleman, looks his stated age. Passively cooperative. Eye contact decreased. Psychomotor activity minimal. Speech quiet, decreased in amount. Affect blunted. Mood is stated as being okay now. Thoughts are slow, but lucid. No obvious loosening of associations. Denies hallucinations. Denies current suicidal or homicidal ideation. Judgment and insight improved over when he came in. Basic fund of knowledge normal. Short and long-term memory grossly intact. Alert and oriented.   LABORATORY RESULTS: Drug screen positive for benzodiazepines. Urinalysis 1+ leukocyte esterase and positive white cells. CBC normal. Chemistry panel: Elevated chloride 109. Alcohol level on presentation was 292.   ASSESSMENT: A 34 year old man with a history of alcohol dependence. Does not have seizures or delirium tremens.  He made a cutting gesture yesterday while he was intoxicated, but did not  follow through with trying to kill himself. Now, he is sober. He has outpatient treatment in the community already set up and a safe place to live. The patient is not likely to benefit or require further inpatient treatment at this time. He has been counseled  about the importance of staying sober if he is going to help get some improvement to his anxiety disorder. Strongly encouraged to follow up with local providers, as well as with an alcoholic anonymous group. I recommend at this time he be discharged and can return home. Follow up with outpatient treatment in the community.   DIAGNOSIS, PRINCIPAL AND PRIMARY:  AXIS I:  Substance-induced mood disorder, resolving.   SECONDARY DIAGNOSES: AXIS I:  Alcohol dependence.               Panic disorder.                Generalized anxiety disorder.  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  AXIS III: Superficial cut to the wrist.  AXIS IV: Moderate chronic stress, jobless, minimal social support.  AXIS V:  Functioning at time of evaluation is 46.    ____________________________ Audery AmelJohn T. Jazzmyne Rasnick, MD jtc:ce D: 10/05/2013 19:04:22 ET T: 10/05/2013 19:16:09 ET JOB#: [id number]  cc: Audery AmelJohn T. Shavona Gunderman, MD, <Dictator> Audery AmelJOHN T Celia Friedland MD ELECTRONICALLY SIGNED 10/06/2013 0:39
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
## 2015-02-12 ENCOUNTER — Ambulatory Visit
Admission: RE | Admit: 2015-02-12 | Discharge: 2015-02-12 | Disposition: A | Discharge status: Home / Self Care | Payer: Disability Insurance | Source: Ambulatory Visit | Attending: *Deleted | Admitting: *Deleted

## 2015-02-12 ENCOUNTER — Other Ambulatory Visit: Payer: Self-pay | Admitting: *Deleted

## 2015-02-12 ENCOUNTER — Ambulatory Visit
Admission: RE | Admit: 2015-02-12 | Discharge: 2015-02-12 | Disposition: A | Discharge status: Home / Self Care | Payer: Disability Insurance | Source: Ambulatory Visit | Attending: Diagnostic Radiology | Admitting: Diagnostic Radiology

## 2015-02-12 DIAGNOSIS — M545 Low back pain: Secondary | ICD-10-CM

## 2015-02-12 DIAGNOSIS — Z87828 Personal history of other (healed) physical injury and trauma: Secondary | ICD-10-CM | POA: Diagnosis not present
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
## 2017-12-27 ENCOUNTER — Other Ambulatory Visit: Payer: Self-pay

## 2017-12-27 ENCOUNTER — Emergency Department
Admission: EM | Admit: 2017-12-27 | Discharge: 2017-12-27 | Disposition: A | Discharge status: Home / Self Care | Payer: Disability Insurance | Attending: Emergency Medicine | Admitting: Emergency Medicine

## 2017-12-27 DIAGNOSIS — K029 Dental caries, unspecified: Secondary | ICD-10-CM

## 2017-12-27 DIAGNOSIS — Z79899 Other long term (current) drug therapy: Secondary | ICD-10-CM | POA: Insufficient documentation

## 2017-12-27 DIAGNOSIS — F17200 Nicotine dependence, unspecified, uncomplicated: Secondary | ICD-10-CM | POA: Insufficient documentation

## 2017-12-27 MED ORDER — LIDOCAINE VISCOUS HCL 2 % MT SOLN: 10.0000 mL | OROMUCOSAL | 0 refills | Status: AC | PRN

## 2017-12-27 MED ORDER — KETOROLAC TROMETHAMINE 10 MG PO TABS: 10.0000 mg | ORAL_TABLET | Freq: Four times a day (QID) | ORAL | 0 refills | Status: AC | PRN

## 2017-12-27 MED ORDER — KETOROLAC TROMETHAMINE 30 MG/ML IJ SOLN
30.0000 mg | Freq: Once | INTRAMUSCULAR | Status: AC
Start: 2017-12-27 — End: 2017-12-27
  Administered 2017-12-27: 30 mg via INTRAMUSCULAR
  Filled 2017-12-27: qty 1

## 2017-12-27 MED ORDER — AMOXICILLIN 500 MG PO CAPS: 500.0000 mg | ORAL_CAPSULE | Freq: Three times a day (TID) | ORAL | 0 refills | Status: AC

## 2017-12-27 MED ORDER — OXYCODONE-ACETAMINOPHEN 5-325 MG PO TABS
1.0000 | ORAL_TABLET | Freq: Once | ORAL | Status: AC
  Administered 2017-12-27: 1 via ORAL
  Filled 2017-12-27: qty 1

## 2017-12-27 MED ORDER — LIDOCAINE VISCOUS HCL 2 % MT SOLN
15.0000 mL | Freq: Once | OROMUCOSAL | Status: AC
  Administered 2017-12-27: 15 mL via OROMUCOSAL
  Filled 2017-12-27: qty 15

## 2017-12-27 NOTE — ED Provider Notes (Signed)
[facility] Emergency Department Provider Note  ____________________________________________  Time seen: Approximately 1:30 PM  I have reviewed the triage vital signs and the nursing notes.   HISTORY  Chief Complaint Dental Pain    HPI [name] is a 37 y.o. male that presents to the emergency department for evaluation of increasing left dental pain for 2 days.  Patient states that he has "decaying wisdom teeth."  He has a fractured tooth that needs to be pulled.  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  He denies fever, chills.   No past medical history on file.  There are no active problems to display for this patient.    Prior to Admission medications   Medication Sig Start Date End Date Taking? Authorizing Provider  amoxicillin (AMOXIL) 500 MG capsule Take 1 capsule (500 mg total) by mouth 3 (three) times daily. 12/27/17   Enid Derry, PA-C  ketorolac (TORADOL) 10 MG tablet Take 1 tablet (10 mg total) by mouth every 6 (six) hours as needed. 12/27/17   Enid Derry, PA-C  lidocaine (XYLOCAINE) 2 % solution Use as directed 10 mLs in the mouth or throat as needed for mouth pain. 12/27/17   Enid Derry, PA-C    Allergies Patient has no known allergies.  No family history on file.  Social History Social History   Tobacco Use  . Smoking status: Current Every Day Smoker  . Smokeless tobacco: Never Used  Substance Use Topics  . Alcohol use: Yes  . Drug use: Not on file     Review of Systems  Constitutional: No fever/chills Cardiovascular: No chest pain. Respiratory: No SOB. Gastrointestinal: No nausea, no vomiting.  Musculoskeletal: Negative for musculoskeletal pain. Skin: Negative for rash, abrasions, lacerations, ecchymosis.   ____________________________________________   PHYSICAL EXAM:  VITAL SIGNS: ED Triage Vitals  Enc Vitals Group      BP 12/27/17 1120 (!) 152/94     Pulse Rate 12/27/17 1117 (!) 108     Resp 12/27/17 1117 18     Temp 12/27/17 1117 98.2 F (36.8 C)     Temp Source 12/27/17 1117 Oral     SpO2 12/27/17 1117 98 %     Weight 12/27/17 1118 175 lb (79.4 kg)     Height 12/27/17 1118  (1.803 m)     Head Circumference --      Peak Flow --      Pain Score 12/27/17 1118 10     Pain Loc --      Pain Edu? --      Excl. in GC? --      Constitutional: Alert and oriented. Well appearing and in no acute distress. Eyes: Conjunctivae are normal. PERRL. EOMI. Head: Atraumatic. ENT:      Ears:      Nose: No congestion/rhinnorhea.      Mouth/Throat: Mucous membranes are moist.  Moderate dental decay to back molars. No swelling or palpable abscess.  No difficulty opening or closing mouth. Neck: No stridor.  Cardiovascular: Normal rate, regular rhythm.  Good peripheral circulation. Respiratory: Normal respiratory effort without tachypnea or retractions. Lungs CTAB. Good air entry to the bases with no decreased or absent breath sounds. Musculoskeletal: Full range of motion to all extremities. No gross deformities appreciated. Neurologic:  Normal speech and language. No gross focal neurologic deficits are appreciated.  Skin:  Skin is  warm, dry and intact. No rash noted.  ____________________________________________   LABS (all labs ordered are listed, but only abnormal results are displayed)  Labs Reviewed - No data to display ____________________________________________  EKG   ____________________________________________  RADIOLOGY  No results found.  ____________________________________________    PROCEDURES  Procedure(s) performed:    Procedures    Medications  oxyCODONE-acetaminophen (PERCOCET/ROXICET) 5-325 MG per tablet 1 tablet (has no administration in time range)  ketorolac (TORADOL) 30 MG/ML injection 30 mg (has no administration in time range)  lidocaine (XYLOCAINE) 2 %  viscous mouth solution 15 mL (has no administration in time range)     ____________________________________________   INITIAL IMPRESSION / ASSESSMENT AND PLAN / ED COURSE  Pertinent labs & imaging results that were available during my care of the patient were reviewed by me and considered in my medical decision making (see chart for details).  Review of the [place] CSRS was performed in accordance of the [facility] prior to dispensing any controlled drugs.   Patient's diagnosis is consistent with dental caries. Vital signs and exam are reassuring. Patient will be discharged home with prescriptions for Toradol, amoxicillin, viscous lidocaine. Patient is to follow up with dentist as directed. Resources were provided. Patient is given ED precautions to return to the ED for any worsening or new symptoms.     ____________________________________________  FINAL CLINICAL IMPRESSION(S) / ED DIAGNOSES  Final diagnoses:  Dental caries      NEW MEDICATIONS STARTED DURING THIS VISIT:  ED Discharge Orders        Ordered    ketorolac (TORADOL) 10 MG tablet  Every 6 hours PRN     12/27/17 1400    amoxicillin (AMOXIL) 500 MG capsule  3 times daily     12/27/17 1400    lidocaine (XYLOCAINE) 2 % solution  As needed     12/27/17 1400          This chart was dictated using voice recognition software/Dragon. Despite best efforts to proofread, errors can occur which can change the meaning. Any change was purely unintentional.    Enid Derry, PA-C 12/27/17 1607    Sharyn Creamer, MD 12/27/17 [phone]

## 2017-12-27 NOTE — ED Triage Notes (Signed)
He arrives today with reports of left upper dental pain  - 10/10 on scale reported

## 2017-12-27 NOTE — Discharge Instructions (Signed)
OPTIONS FOR DENTAL FOLLOW UP CARE ° °[place] Department of Health and Human Services - Local Safety Net Dental Clinics °http://www.ncdhhs.gov/dph/oralhealth/services/safetynetclinics.htm °  °[facility] (336-562-3123) ° °[facility] [place] (919-933-9087) ° °[facility] ([phone] ext [phone]) ° °[facility] ([phone]) ° °[facility] ([phone]) °This clinic caters to the indigent population and is on a lottery system. °Location: °[facility], [address], [place] °Clinic Hours: °Wednesdays from 6pm - 9pm, patients seen by a lottery system. °For dates, call or go to [email or web address]/[email or web address] °Services: °Cleanings, fillings and simple extractions. °Payment Options: °DENTAL WORK IS FREE OF CHARGE. Bring proof of income or support. °Best way to get seen: °Arrive at 5:15 pm - this is a lottery, NOT first come/first serve, so arriving earlier will not increase your chances of being seen. °  °  °[facility] °[phone] °Select option 1 for emergencies °  °Location: °[facility], [address], [place] °Clinic Hours: °No walk-ins accepted - call the day before to schedule an appointment. °Check in times are 9:30 am and 1:30 pm. °Services: °Simple extractions, temporary fillings, pulpectomy/pulp debridement, uncomplicated abscess drainage. °Payment Options: °PAYMENT IS DUE AT THE TIME OF SERVICE.  Fee is usually $100-200, additional surgical procedures (e.g. abscess drainage) may be extra. °Cash, checks, Visa/MasterCard accepted.  Can file Medicaid if patient is covered for dental - patient should call case worker to check. °No discount for [facility] patients. °Best way to get seen: °MUST call the day before and get onto the schedule. Can usually be seen the next 1-2 days. No walk-ins accepted. °  °  °[facility] °[phone] °   °Location: °[facility], [address], [place] °Clinic Hours: °M, W, Th, F 8am or 1:30pm, Tues 9a or 1:30 - first come/first served. °Services: °Simple extractions, temporary fillings, uncomplicated abscess drainage.  You do not need to be an [place] resident. °Payment Options: °PAYMENT IS DUE AT THE TIME OF SERVICE. °Dental insurance, otherwise sliding scale - bring proof of income or support. °Depending on income and treatment needed, cost is usually $50-200. °Best way to get seen: °Arrive early as it is first come/first served. °  °  °[facility] °[phone] °  °Location: °[address] °Clinic Hours: °Mon-Thu 8a-5p °Services: °Most basic dental services including extractions and fillings. °Payment Options: °PAYMENT IS DUE AT THE TIME OF SERVICE. °Sliding scale, up to 50% off - bring proof if income or support. °Medicaid with dental option accepted. °Best way to get seen: °Call to schedule an appointment, can usually be seen within 2 weeks OR they will try to see walk-ins - show up at 8a or 2p (you may have to wait). °  °  °[facility] °[phone] °[place] RESIDENTS ONLY °  °Location: °[facility], [address], [place], [place] [address] °Clinic Hours: By appointment only. °Monday - Thursday 8am-5pm, Friday 8am-12pm °Services: Cleanings, fillings, extractions. °Payment Options: °PAYMENT IS DUE AT THE TIME OF SERVICE. °Cash, Visa or MasterCard. Sliding scale - $30 minimum per service. °Best way to get seen: °Come in to office, complete packet and make an appointment - need proof of income °or support monies for each household member and proof of [place] residence. °Usually takes about a month to get in. °  °  °[facility] °[phone] °  °Location: °[address]   [place] °Clinic Hours: Walk-in Urgent Care Dental Services are offered Monday-Friday  mornings only. °The numbers of emergencies accepted daily is limited to the number of °providers available. °Maximum 15 - Mondays, Wednesdays & Thursdays °Maximum 10 - Tuesdays & Fridays °Services: °You do not need to be a [place] resident to be seen for a dental emergency. °Emergencies are defined as pain, swelling, abnormal bleeding, or dental trauma. Walkins will receive x-rays if needed. °NOTE: Dental cleaning is not an emergency. °Payment Options: °PAYMENT IS DUE AT THE TIME OF SERVICE. °Minimum co-pay is $40.00 for uninsured patients. °Minimum co-pay is $3.00 for Medicaid with dental coverage. °Dental Insurance is accepted and must be presented at time of visit. °Medicare does not cover dental. °Forms of payment: Cash, credit card, checks. °Best way to get seen: °If not previously registered with the clinic, walk-in dental registration begins at 7:15 am and is on a first come/first serve basis. °If previously registered with the clinic, call to make an appointment. °  °  °[facility] °[phone] °[place] RESIDENTS ONLY °  °Location: °[address], [place], [place] °Clinic Hours: °Mon-Thu 10a-2p °Services: Extractions only! °Payment Options: °FREE (donations accepted) - bring proof of income or support °Best way to get seen: °Call and schedule an appointment OR come at 8am on the 1st Monday of every month (except for holidays) when it is first come/first served. °  °  °[facility] °[phone] °  °Location: °[address], [place] °Clinic Hours: °Friday mornings °Services, Payment Options, Best way to get seen: °Call for info °

## 2017-12-27 NOTE — ED Notes (Signed)
Pt states has chronic wisdom tooth pain and a cracked tooth. States uses [facility] but they are closed and needs something for pain.
